# Patient Record
Sex: Male | Born: 1945 | Race: White | Hispanic: No | Marital: Married | Smoking: Never smoker
Health system: Southern US, Community
[De-identification: ages and names within clinical notes are randomized; demographics above are authoritative.]

## PROBLEM LIST (undated history)

## (undated) DIAGNOSIS — E119 Type 2 diabetes mellitus without complications: Secondary | ICD-10-CM

## (undated) DIAGNOSIS — I10 Essential (primary) hypertension: Secondary | ICD-10-CM

---

## 1997-11-22 ENCOUNTER — Ambulatory Visit (HOSPITAL_COMMUNITY): Admission: RE | Admit: 1997-11-22 | Discharge: 1997-11-22 | Payer: Self-pay | Admitting: Internal Medicine

## 1997-11-22 ENCOUNTER — Encounter: Payer: Self-pay | Admitting: Internal Medicine

## 1998-12-17 ENCOUNTER — Other Ambulatory Visit: Admission: RE | Admit: 1998-12-17 | Discharge: 1998-12-17 | Payer: Self-pay | Admitting: General Surgery

## 1998-12-26 ENCOUNTER — Encounter: Payer: Self-pay | Admitting: General Surgery

## 1998-12-26 ENCOUNTER — Encounter: Admission: RE | Admit: 1998-12-26 | Discharge: 1998-12-26 | Payer: Self-pay | Admitting: General Surgery

## 1999-01-04 ENCOUNTER — Encounter (INDEPENDENT_AMBULATORY_CARE_PROVIDER_SITE_OTHER): Payer: Self-pay | Admitting: Specialist

## 1999-01-04 ENCOUNTER — Ambulatory Visit (HOSPITAL_COMMUNITY): Admission: RE | Admit: 1999-01-04 | Discharge: 1999-01-04 | Payer: Self-pay | Admitting: General Surgery

## 1999-01-04 ENCOUNTER — Encounter: Payer: Self-pay | Admitting: General Surgery

## 2000-02-08 ENCOUNTER — Encounter: Payer: Self-pay | Admitting: Emergency Medicine

## 2000-02-08 ENCOUNTER — Emergency Department (HOSPITAL_COMMUNITY): Admission: EM | Admit: 2000-02-08 | Discharge: 2000-02-08 | Payer: Self-pay | Admitting: *Deleted

## 2000-06-16 ENCOUNTER — Encounter: Payer: Self-pay | Admitting: Otolaryngology

## 2000-06-16 ENCOUNTER — Encounter: Admission: RE | Admit: 2000-06-16 | Discharge: 2000-06-16 | Payer: Self-pay | Admitting: Otolaryngology

## 2003-08-22 ENCOUNTER — Encounter: Admission: RE | Admit: 2003-08-22 | Discharge: 2003-08-22 | Payer: Self-pay | Admitting: Internal Medicine

## 2003-09-29 ENCOUNTER — Ambulatory Visit (HOSPITAL_COMMUNITY): Admission: RE | Admit: 2003-09-29 | Discharge: 2003-09-29 | Payer: Self-pay | Admitting: Gastroenterology

## 2003-10-20 ENCOUNTER — Encounter: Admission: RE | Admit: 2003-10-20 | Discharge: 2003-10-20 | Payer: Self-pay | Admitting: Internal Medicine

## 2005-02-27 ENCOUNTER — Ambulatory Visit: Payer: Self-pay | Admitting: Internal Medicine

## 2005-03-31 ENCOUNTER — Ambulatory Visit: Payer: Self-pay | Admitting: Internal Medicine

## 2005-04-09 ENCOUNTER — Ambulatory Visit: Payer: Self-pay | Admitting: Gastroenterology

## 2005-04-23 ENCOUNTER — Encounter (INDEPENDENT_AMBULATORY_CARE_PROVIDER_SITE_OTHER): Payer: Self-pay | Admitting: *Deleted

## 2005-04-23 ENCOUNTER — Ambulatory Visit: Payer: Self-pay | Admitting: Gastroenterology

## 2006-01-29 ENCOUNTER — Ambulatory Visit: Payer: Self-pay | Admitting: Internal Medicine

## 2006-01-29 LAB — CONVERTED CEMR LAB
BUN: 12 mg/dL (ref 6–23)
Creatinine, Ser: 1.2 mg/dL (ref 0.4–1.5)
Hgb A1c MFr Bld: 5.6 % (ref 4.6–6.0)
Potassium: 4.1 meq/L (ref 3.5–5.1)

## 2006-02-04 ENCOUNTER — Encounter: Payer: Self-pay | Admitting: Internal Medicine

## 2006-02-25 ENCOUNTER — Ambulatory Visit: Payer: Self-pay | Admitting: Internal Medicine

## 2006-04-14 ENCOUNTER — Ambulatory Visit (HOSPITAL_COMMUNITY): Admission: RE | Admit: 2006-04-14 | Discharge: 2006-04-14 | Payer: Self-pay | Admitting: General Surgery

## 2006-05-11 ENCOUNTER — Ambulatory Visit: Payer: Self-pay | Admitting: Internal Medicine

## 2006-05-11 LAB — CONVERTED CEMR LAB
ALT: 33 units/L (ref 0–40)
AST: 27 units/L (ref 0–37)
Cholesterol: 247 mg/dL (ref 0–200)
Triglycerides: 179 mg/dL — ABNORMAL HIGH (ref 0–149)

## 2006-08-13 ENCOUNTER — Ambulatory Visit: Payer: Self-pay | Admitting: Emergency Medicine

## 2006-09-18 ENCOUNTER — Other Ambulatory Visit: Payer: Self-pay

## 2006-09-18 ENCOUNTER — Ambulatory Visit: Payer: Self-pay | Admitting: Emergency Medicine

## 2006-09-18 ENCOUNTER — Inpatient Hospital Stay: Payer: Self-pay | Admitting: Internal Medicine

## 2008-02-08 ENCOUNTER — Encounter: Admission: RE | Admit: 2008-02-08 | Discharge: 2008-02-08 | Payer: Self-pay | Admitting: Specialist

## 2008-03-16 ENCOUNTER — Encounter (INDEPENDENT_AMBULATORY_CARE_PROVIDER_SITE_OTHER): Payer: Self-pay | Admitting: *Deleted

## 2009-06-27 ENCOUNTER — Telehealth (INDEPENDENT_AMBULATORY_CARE_PROVIDER_SITE_OTHER): Payer: Self-pay

## 2010-03-05 NOTE — Progress Notes (Signed)
Summary: Schedule overdue colon recall  Phone Note Outgoing Call Call back at [facility] Phone [phone] Call back at Work Phone [phone] Call back at [phone]   Call placed by: Darcey Nora RN, CGRN,  Jun 27, 2009 3:10 PM Call placed to: Patient Summary of Call: Attempted to place a call to the patient at all 3 above numbers to discuss overdue colon recall from 04/2008.  All the above phone numbers have been disconnected.  A recall letter will be mailed to the address on file. Initial call taken by: Darcey Nora RN, CGRN,  Jun 27, 2009 3:14 PM

## 2010-06-13 ENCOUNTER — Other Ambulatory Visit: Payer: Self-pay | Admitting: Dermatology

## 2010-06-21 NOTE — Op Note (Signed)
NAME:  Russell Moody, Russell Moody NO.:  [id number]   MEDICAL RECORD NO.:  [id number]          PATIENT TYPE:  AMB   LOCATION:  DAY                          FACILITY:  [facility]   PHYSICIAN:  Timothy E. Earlene Plater, M.D. DATE OF BIRTH:  [birth year]   DATE OF PROCEDURE:  04/14/2006  DATE OF DISCHARGE:  04/14/2006                               OPERATIVE REPORT   PREOPERATIVE DIAGNOSIS:  Anal warts.   POSTOPERATIVE DIAGNOSIS:  Anal warts.   PROCEDURE:  Carbon dioxide destruction of anal warts.   SURGEON:  Timothy E. Earlene Plater, M.D.   ANESTHESIA:  General.   Mr. Macke is 69, otherwise healthy.  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  Anoderm and rectal  are spared.  He has agreed to this procedure.  He is seen, identified,  and the permit signed.   Patient was taken to the operating room, placed supine.  LMA anesthesia  provided.  He is placed in lithotomy.  The perianal area inspected,  prepped and draped in the usual fashion.  Perianal condylomata were  present in small clusters, covering the skin of the right and left sides  of the anal area.  Using magnification and small beam CO2 laser at 4-6  watts, each of these condylomata were individually destroyed.  The skin  was preserved as much as possible.  All areas were double checked.  There were no residuals.  Dry sterile dressing.  He tolerated it well.  Counts correct.  He was removed to the recovery room in good condition.  Written and verbal instructions were given to him and his wife,  including Tylox 36 for pain.  He will be seen and followed in the  office.      Timothy E. Earlene Plater, M.D.  Electronically Signed     TED/MEDQ  D:  04/14/2006  T:  04/16/2006  Job:  [id number]

## 2011-04-02 ENCOUNTER — Other Ambulatory Visit: Payer: Self-pay | Admitting: Oral Surgery

## 2011-08-19 ENCOUNTER — Encounter: Payer: Self-pay | Admitting: Gastroenterology

## 2012-12-21 ENCOUNTER — Other Ambulatory Visit: Payer: Self-pay | Admitting: Dermatology

## 2013-03-10 ENCOUNTER — Other Ambulatory Visit: Payer: Self-pay | Admitting: Dermatology

## 2014-10-25 ENCOUNTER — Other Ambulatory Visit: Payer: Self-pay | Admitting: Family Medicine

## 2014-11-05 ENCOUNTER — Emergency Department
Admission: EM | Admit: 2014-11-05 | Discharge: 2014-11-05 | Disposition: A | Discharge status: Home / Self Care | Payer: 59 | Attending: Emergency Medicine | Admitting: Emergency Medicine

## 2014-11-05 ENCOUNTER — Encounter: Payer: Self-pay | Admitting: Emergency Medicine

## 2014-11-05 ENCOUNTER — Emergency Department: Payer: 59

## 2014-11-05 DIAGNOSIS — E119 Type 2 diabetes mellitus without complications: Secondary | ICD-10-CM | POA: Diagnosis not present

## 2014-11-05 DIAGNOSIS — W010XXA Fall on same level from slipping, tripping and stumbling without subsequent striking against object, initial encounter: Secondary | ICD-10-CM | POA: Diagnosis not present

## 2014-11-05 DIAGNOSIS — Y9289 Other specified places as the place of occurrence of the external cause: Secondary | ICD-10-CM | POA: Diagnosis not present

## 2014-11-05 DIAGNOSIS — Y998 Other external cause status: Secondary | ICD-10-CM | POA: Diagnosis not present

## 2014-11-05 DIAGNOSIS — S62112A Displaced fracture of triquetrum [cuneiform] bone, left wrist, initial encounter for closed fracture: Secondary | ICD-10-CM

## 2014-11-05 DIAGNOSIS — Z79899 Other long term (current) drug therapy: Secondary | ICD-10-CM | POA: Insufficient documentation

## 2014-11-05 DIAGNOSIS — Y9389 Activity, other specified: Secondary | ICD-10-CM | POA: Diagnosis not present

## 2014-11-05 DIAGNOSIS — I10 Essential (primary) hypertension: Secondary | ICD-10-CM | POA: Insufficient documentation

## 2014-11-05 DIAGNOSIS — S6992XA Unspecified injury of left wrist, hand and finger(s), initial encounter: Secondary | ICD-10-CM | POA: Diagnosis present

## 2014-11-05 HISTORY — DX: Essential (primary) hypertension: I10

## 2014-11-05 HISTORY — DX: Type 2 diabetes mellitus without complications: E11.9

## 2014-11-05 MED ORDER — OXYCODONE-ACETAMINOPHEN 5-325 MG PO TABS
1.0000 | ORAL_TABLET | Freq: Once | ORAL | Status: AC
Start: 2014-11-05 — End: 2014-11-05
  Administered 2014-11-05: 1 via ORAL
  Filled 2014-11-05: qty 1

## 2014-11-05 MED ORDER — OXYCODONE-ACETAMINOPHEN 5-325 MG PO TABS
1.0000 | ORAL_TABLET | Freq: Once | ORAL | Status: AC
  Administered 2014-11-05: 1 via ORAL
  Filled 2014-11-05: qty 1

## 2014-11-05 MED ORDER — OXYCODONE-ACETAMINOPHEN 5-325 MG PO TABS: 1.0000 | ORAL_TABLET | Freq: Four times a day (QID) | ORAL | Status: AC | PRN

## 2014-11-05 NOTE — ED Provider Notes (Signed)
[facility] Emergency Department Provider Note  ____________________________________________  Time seen: Approximately 9:21 AM  I have reviewed the triage vital signs and the nursing notes.   HISTORY  Chief Complaint Fall    HPI Russell Moody is a 69 y.o. male who presents to emergency department complaining of left wrist pain status post falling on stretch left hand last night. He states that he tripped and fell backwards trying to catch himself with his left hand and landing on the wrist. He initially states that initially he thought "I just sprained it" but the pain has continued to increase over the intervening period. He was unable to sleep due to the pain. He denies any numbness or tingling in the digits. He denies any gross deformity but does endorse some swelling.   Past Medical History  Diagnosis Date  . Hypertension   . Diabetes mellitus without complication ([place])     There are no active problems to display for this patient.   History reviewed. No pertinent past surgical history.  Current Outpatient Rx  Name  Route  Sig  Dispense  Refill  . buPROPion (WELLBUTRIN XL) 300 MG 24 hr tablet   Oral   Take 300 mg by mouth daily.         . citalopram (CELEXA) 10 MG tablet   Oral   Take 10 mg by mouth daily.         Marland Kitchen lisinopril (PRINIVIL,ZESTRIL) 20 MG tablet   Oral   Take 20 mg by mouth daily.         . pravastatin (PRAVACHOL) 40 MG tablet   Oral   Take 40 mg by mouth daily.         . tamsulosin (FLOMAX) 0.4 MG CAPS capsule   Oral   Take 0.4 mg by mouth.         . verapamil (CALAN) 120 MG tablet   Oral   Take 120 mg by mouth 3 (three) times daily.         Marland Kitchen oxyCODONE-acetaminophen (ROXICET) 5-325 MG tablet   Oral   Take 1 tablet by mouth every 6 (six) hours as needed for severe pain.   20 tablet   0     Allergies Augmentin  No family history on file.  Social History Social History  Substance Use Topics  .  Smoking status: Never Smoker   . Smokeless tobacco: None  . Alcohol Use: Yes    Review of Systems Constitutional: No fever/chills Eyes: No visual changes. ENT: No sore throat. Cardiovascular: Denies chest pain. Respiratory: Denies shortness of breath. Gastrointestinal: No abdominal pain.  No nausea, no vomiting.  No diarrhea.  No constipation. Genitourinary: Negative for dysuria. Musculoskeletal: Negative for back pain. He endorses left wrist pain. Skin: Negative for rash. Neurological: Negative for headaches, focal weakness or numbness.  10-point ROS otherwise negative.  ____________________________________________   PHYSICAL EXAM:  VITAL SIGNS: ED Triage Vitals  Enc Vitals Group     BP 11/05/14 0846 148/87 mmHg     Pulse Rate 11/05/14 0846 65     Resp 11/05/14 0846 18     Temp 11/05/14 0846 98.1 F (36.7 C)     Temp Source 11/05/14 0846 Oral     SpO2 11/05/14 0846 98 %     Weight 11/05/14 0846 270 lb (122.471 kg)     Height 11/05/14 0846 6' (1.829 m)     Head Cir --      Peak Flow --  Pain Score 11/05/14 0900 9     Pain Loc --      Pain Edu? --      Excl. in [place]? --     Constitutional: Alert and oriented. Well appearing and in no acute distress. Eyes: Conjunctivae are normal. PERRL. EOMI. Head: Atraumatic. Nose: No congestion/rhinnorhea. Mouth/Throat: Mucous membranes are moist.  Oropharynx non-erythematous. Neck: No stridor.   Cardiovascular: Normal rate, regular rhythm. Grossly normal heart sounds.  Good peripheral circulation. Respiratory: Normal respiratory effort.  No retractions. Lungs CTAB. Gastrointestinal: Soft and nontender. No distention. No abdominal bruits. No CVA tenderness. Musculoskeletal: No lower extremity tenderness nor edema.  No joint effusions. Moderate edema noted to left dorsal aspect of wrist. No visible deformity. No ecchymosis, contusion, abrasion, laceration noted. Patient is nontender to palpation over distal yes and ulna. He is  tender to palpation over her carpal bones of left hand. Range of motion of wrist is limited due to pain. Good range of motion of digits distal to injury. Pulses intact. Sensation intact. Neurologic:  Normal speech and language. No gross focal neurologic deficits are appreciated. No gait instability. Skin:  Skin is warm, dry and intact. No rash noted. Psychiatric: Mood and affect are normal. Speech and behavior are normal.  ____________________________________________   LABS (all labs ordered are listed, but only abnormal results are displayed)  Labs Reviewed - No data to display ____________________________________________  EKG   ____________________________________________  RADIOLOGY  Complete left wrist x-Rigdon Impression: Dorsal triquetral fracture. No other fracture, subluxation, or dislocation identified. Soft tissue swelling is noted. ____________________________________________   PROCEDURES  Procedure(s) performed: None  Critical Care performed: No  ____________________________________________   INITIAL IMPRESSION / ASSESSMENT AND PLAN / ED COURSE  Pertinent labs & imaging results that were available during my care of the patient were reviewed by me and considered in my medical decision making (see chart for details).  The patient is a 69 year old male who presents to the emergency department complaining of left wrist pain after a fall yesterday evening. States that he tried to catch himself with his left hand. The pain has increased over the intervening time. Patient's history, symptoms, exam, and radiological findings reveal a triquetral fracture to left wrist. Wrist is stabilized with a fiberglass splint. Patient has a sling and was instructed to use the same. Patient was given pain medication until he can see orthopedics. Patient verbalizes understanding of the diagnosis and the treatment plan. Patient verbalizes compliance with  same. ____________________________________________   FINAL CLINICAL IMPRESSION(S) / ED DIAGNOSES  Final diagnoses:  Triquetral fracture, left, closed, initial encounter      Darletta Moll, PA-C 11/05/14 [place], MD 11/05/14 1234

## 2014-11-05 NOTE — ED Notes (Signed)
Fell yesterday  Injury to left arm

## 2014-11-05 NOTE — Discharge Instructions (Signed)

## 2015-01-12 ENCOUNTER — Encounter: Payer: Self-pay | Admitting: Gastroenterology

## 2015-09-03 ENCOUNTER — Other Ambulatory Visit: Payer: Self-pay | Admitting: Unknown Physician Specialty

## 2015-09-03 DIAGNOSIS — N401 Enlarged prostate with lower urinary tract symptoms: Secondary | ICD-10-CM

## 2015-09-03 DIAGNOSIS — R801 Persistent proteinuria, unspecified: Secondary | ICD-10-CM

## 2015-09-03 DIAGNOSIS — N410 Acute prostatitis: Secondary | ICD-10-CM

## 2015-09-03 DIAGNOSIS — R3911 Hesitancy of micturition: Secondary | ICD-10-CM

## 2015-09-06 ENCOUNTER — Ambulatory Visit
Admission: RE | Admit: 2015-09-06 | Discharge: 2015-09-06 | Disposition: A | Discharge status: Home / Self Care | Payer: 59 | Source: Ambulatory Visit | Attending: Unknown Physician Specialty | Admitting: Unknown Physician Specialty

## 2015-09-06 DIAGNOSIS — R3911 Hesitancy of micturition: Secondary | ICD-10-CM | POA: Diagnosis not present

## 2015-09-06 DIAGNOSIS — N401 Enlarged prostate with lower urinary tract symptoms: Secondary | ICD-10-CM | POA: Diagnosis not present

## 2015-09-06 DIAGNOSIS — N281 Cyst of kidney, acquired: Secondary | ICD-10-CM | POA: Diagnosis not present

## 2015-09-06 DIAGNOSIS — R801 Persistent proteinuria, unspecified: Secondary | ICD-10-CM | POA: Diagnosis not present

## 2015-09-06 DIAGNOSIS — N410 Acute prostatitis: Secondary | ICD-10-CM | POA: Diagnosis not present

## 2015-09-06 DIAGNOSIS — R93421 Abnormal radiologic findings on diagnostic imaging of right kidney: Secondary | ICD-10-CM | POA: Diagnosis not present

## 2016-01-23 ENCOUNTER — Other Ambulatory Visit: Payer: Self-pay | Admitting: Neurology

## 2016-01-23 DIAGNOSIS — G44209 Tension-type headache, unspecified, not intractable: Secondary | ICD-10-CM

## 2016-02-07 ENCOUNTER — Ambulatory Visit
Admission: RE | Admit: 2016-02-07 | Discharge: 2016-02-07 | Disposition: A | Discharge status: Home / Self Care | Payer: 59 | Source: Ambulatory Visit | Attending: Neurology | Admitting: Neurology

## 2016-02-07 DIAGNOSIS — R51 Headache: Secondary | ICD-10-CM | POA: Diagnosis present

## 2016-02-07 DIAGNOSIS — D320 Benign neoplasm of cerebral meninges: Secondary | ICD-10-CM | POA: Insufficient documentation

## 2016-02-07 DIAGNOSIS — G44209 Tension-type headache, unspecified, not intractable: Secondary | ICD-10-CM

## 2016-02-07 DIAGNOSIS — J329 Chronic sinusitis, unspecified: Secondary | ICD-10-CM | POA: Diagnosis not present

## 2016-02-07 DIAGNOSIS — D329 Benign neoplasm of meninges, unspecified: Secondary | ICD-10-CM | POA: Diagnosis present

## 2016-02-07 MED ORDER — GADOBENATE DIMEGLUMINE 529 MG/ML IV SOLN: 20.0000 mL | Freq: Once | INTRAVENOUS | Status: DC | PRN

## 2016-04-21 ENCOUNTER — Other Ambulatory Visit: Payer: Self-pay | Admitting: Neurology

## 2016-04-21 DIAGNOSIS — R519 Headache, unspecified: Secondary | ICD-10-CM

## 2016-04-21 DIAGNOSIS — D329 Benign neoplasm of meninges, unspecified: Secondary | ICD-10-CM

## 2016-04-21 DIAGNOSIS — R51 Headache: Principal | ICD-10-CM

## 2016-06-20 ENCOUNTER — Ambulatory Visit
Admission: RE | Admit: 2016-06-20 | Discharge: 2016-06-20 | Disposition: A | Discharge status: Home / Self Care | Payer: 59 | Source: Ambulatory Visit | Attending: Neurology | Admitting: Neurology

## 2016-06-20 DIAGNOSIS — D329 Benign neoplasm of meninges, unspecified: Secondary | ICD-10-CM | POA: Diagnosis not present

## 2016-06-20 DIAGNOSIS — R51 Headache: Secondary | ICD-10-CM | POA: Insufficient documentation

## 2016-06-20 DIAGNOSIS — R519 Headache, unspecified: Secondary | ICD-10-CM

## 2016-06-20 MED ORDER — GADOBENATE DIMEGLUMINE 529 MG/ML IV SOLN
20.0000 mL | Freq: Once | INTRAVENOUS | Status: AC | PRN
  Administered 2016-06-20: 20 mL via INTRAVENOUS

## 2016-07-08 ENCOUNTER — Ambulatory Visit: Payer: Medicare Other

## 2017-01-25 ENCOUNTER — Emergency Department (HOSPITAL_COMMUNITY)
Admission: EM | Admit: 2017-01-25 | Discharge: 2017-01-26 | Disposition: A | Discharge status: Home / Self Care | Payer: 59 | Attending: Emergency Medicine | Admitting: Emergency Medicine

## 2017-01-25 ENCOUNTER — Other Ambulatory Visit: Payer: Self-pay

## 2017-01-25 ENCOUNTER — Emergency Department (HOSPITAL_COMMUNITY): Payer: 59

## 2017-01-25 ENCOUNTER — Encounter (HOSPITAL_COMMUNITY): Payer: Self-pay | Admitting: Emergency Medicine

## 2017-01-25 DIAGNOSIS — Z79899 Other long term (current) drug therapy: Secondary | ICD-10-CM | POA: Diagnosis not present

## 2017-01-25 DIAGNOSIS — R1013 Epigastric pain: Secondary | ICD-10-CM | POA: Diagnosis present

## 2017-01-25 DIAGNOSIS — I10 Essential (primary) hypertension: Secondary | ICD-10-CM | POA: Insufficient documentation

## 2017-01-25 DIAGNOSIS — E119 Type 2 diabetes mellitus without complications: Secondary | ICD-10-CM | POA: Diagnosis not present

## 2017-01-25 DIAGNOSIS — R079 Chest pain, unspecified: Secondary | ICD-10-CM | POA: Diagnosis not present

## 2017-01-25 LAB — CBC
HCT: 44.9 % (ref 39.0–52.0)
HEMOGLOBIN: 15.6 g/dL (ref 13.0–17.0)
MCH: 30.9 pg (ref 26.0–34.0)
MCHC: 34.7 g/dL (ref 30.0–36.0)
MCV: 88.9 fL (ref 78.0–100.0)
PLATELETS: 101 10*3/uL — AB (ref 150–400)
RBC: 5.05 MIL/uL (ref 4.22–5.81)
RDW: 13.7 % (ref 11.5–15.5)
WBC: 7 10*3/uL (ref 4.0–10.5)

## 2017-01-25 LAB — I-STAT TROPONIN, ED: TROPONIN I, POC: 0.01 ng/mL (ref 0.00–0.08)

## 2017-01-25 LAB — COMPREHENSIVE METABOLIC PANEL
ALBUMIN: 3.4 g/dL — AB (ref 3.5–5.0)
ALT: 27 U/L (ref 17–63)
ANION GAP: 8 (ref 5–15)
AST: 49 U/L — AB (ref 15–41)
Alkaline Phosphatase: 88 U/L (ref 38–126)
BILIRUBIN TOTAL: 1.1 mg/dL (ref 0.3–1.2)
BUN: 16 mg/dL (ref 6–20)
CHLORIDE: 108 mmol/L (ref 101–111)
CO2: 21 mmol/L — ABNORMAL LOW (ref 22–32)
Calcium: 8.4 mg/dL — ABNORMAL LOW (ref 8.9–10.3)
Creatinine, Ser: 1.34 mg/dL — ABNORMAL HIGH (ref 0.61–1.24)
GFR calc Af Amer: 59 mL/min — ABNORMAL LOW (ref >60)
GFR, EST NON AFRICAN AMERICAN: 51 mL/min — AB (ref >60)
Glucose, Bld: 224 mg/dL — ABNORMAL HIGH (ref 65–99)
POTASSIUM: 3.8 mmol/L (ref 3.5–5.1)
Sodium: 137 mmol/L (ref 135–145)
TOTAL PROTEIN: 6.3 g/dL — AB (ref 6.5–8.1)

## 2017-01-25 LAB — LIPASE, BLOOD: Lipase: 32 U/L (ref 11–51)

## 2017-01-25 MED ORDER — GI COCKTAIL ~~LOC~~
30.0000 mL | Freq: Once | ORAL | Status: AC
  Administered 2017-01-25: 30 mL via ORAL
  Filled 2017-01-25: qty 30

## 2017-01-25 NOTE — ED Notes (Signed)
Pt states that he is getting a little bit of a headache and "seems like the pain in his chest is creeping up again". Will speak with the MD.

## 2017-01-25 NOTE — ED Triage Notes (Signed)
Per EMS, pt c/o non-radiating epigastric pain that began approx. 1 hour PTA. Pt also c/o nausea, denies vomiting. No shortness of breath/lightheadedness/dizziness. 324 aspirin and 2NTG given with no change in pain.EMS vitals: BP-112/75, SpO2-94% room air, HR 77

## 2017-01-25 NOTE — ED Notes (Signed)
Patient transported to [facility] 

## 2017-01-25 NOTE — Discharge Instructions (Signed)
Please read and follow all provided instructions.  Your diagnoses today include:  1. Chest pain, unspecified type     Tests performed today include: An EKG of your heart A chest x-Harp Cardiac enzymes - a blood test for heart muscle damage Blood counts and electrolytes Vital signs. See below for your results today.   Medications prescribed:   Take any prescribed medications only as directed.  Follow-up instructions: Please follow-up with your primary care provider as soon as you can for further evaluation of your symptoms.   Return instructions:  SEEK IMMEDIATE MEDICAL ATTENTION IF: You have severe chest pain, especially if the pain is crushing or pressure-like and spreads to the arms, back, neck, or jaw, or if you have sweating, nausea (feeling sick to your stomach), or shortness of breath. THIS IS AN EMERGENCY. Don't wait to see if the pain will go away. Get medical help at once. Call 911 or 0 (operator). DO NOT drive yourself to the hospital.  Your chest pain gets worse and does not go away with rest.  You have an attack of chest pain lasting longer than usual, despite rest and treatment with the medications your caregiver has prescribed.  You wake from sleep with chest pain or shortness of breath. You feel dizzy or faint. You have chest pain not typical of your usual pain for which you originally saw your caregiver.  You have any other emergent concerns regarding your health.  Additional Information: Chest pain comes from many different causes. Your caregiver has diagnosed you as having chest pain that is not specific for one problem, but does not require admission.  You are at low risk for an acute heart condition or other serious illness.   Your vital signs today were: BP 135/88    Pulse 76    Temp 98.2 F (36.8 C) (Oral)    Resp (!) 21    Ht 6' (1.829 m)    Wt 122.5 kg (270 lb)    SpO2 96%    BMI 36.62 kg/m  If your blood pressure (BP) was elevated above 135/85 this visit,  please have this repeated by your doctor within one month. --------------

## 2017-01-25 NOTE — ED Provider Notes (Signed)
[facility] EMERGENCY DEPARTMENT Provider Note   CSN: [id number] Arrival date & time: 01/25/17  2217     History   Chief Complaint Chief Complaint  Patient presents with  . Chest Pain    HPI Russell Moody is a 71 y.o. male.  HPI  71 y.o. male with a hx of HTN, DM, presents to the Emergency Department today via EMS due to non radiating epigastric pain that occurred 1 hour PTA. States this occurred when he started lying down for bed. Notes nausea without emesis. No SOB. Denies diaphoresis. Notes pain 15/10 initially until EMS arrived and gave 324 ASA as well as NTG x2. States pain reduced to 5/10. States it just feels like a pressure in is stomach. No N/V. Denies chest pain. No numbness/tingling. No hx ACS. Denies cough/congestion. No fevers. No numbness in BLE. No other symptoms noted   Past Medical History:  Diagnosis Date  . Diabetes mellitus without complication ([place])   . Hypertension     There are no active problems to display for this patient.   History reviewed. No pertinent surgical history.     Home Medications    Prior to Admission medications   Medication Sig Start Date End Date Taking? Authorizing Provider  buPROPion (WELLBUTRIN XL) 300 MG 24 hr tablet Take 300 mg by mouth daily.    [provider]  citalopram (CELEXA) 10 MG tablet Take 10 mg by mouth daily.    [provider]  lisinopril (PRINIVIL,ZESTRIL) 20 MG tablet Take 20 mg by mouth daily.    [provider]  oxyCODONE-acetaminophen (ROXICET) 5-325 MG tablet Take 1 tablet by mouth every 6 (six) hours as needed for severe pain. 11/05/14   Cuthriell, Charline Bills, PA-C  pravastatin (PRAVACHOL) 40 MG tablet Take 40 mg by mouth daily.    [provider]  tamsulosin (FLOMAX) 0.4 MG CAPS capsule Take 0.4 mg by mouth.    [provider]  verapamil (CALAN) 120 MG tablet Take 120 mg by mouth 3 (three) times daily.    [provider]     Family History No family history on file.  Social History Social History   Tobacco Use  . Smoking status: Never Smoker  . Smokeless tobacco: Never Used  Substance Use Topics  . Alcohol use: Yes  . Drug use: Not on file     Allergies   Augmentin [amoxicillin-pot clavulanate]   Review of Systems Review of Systems ROS reviewed and all are negative for acute change except as noted in the HPI.  Physical Exam Updated Vital Signs Pulse 77   Temp 98.2 F (36.8 C) (Oral)   Resp 16   Ht 6' (1.829 m)   Wt 122.5 kg (270 lb)   SpO2 94%   BMI 36.62 kg/m   Physical Exam  Constitutional: He is oriented to person, place, and time. He appears well-developed and well-nourished. No distress.  HENT:  Head: Normocephalic and atraumatic.  Right Ear: Tympanic membrane, external ear and ear canal normal.  Left Ear: Tympanic membrane, external ear and ear canal normal.  Nose: Nose normal.  Mouth/Throat: Uvula is midline, oropharynx is clear and moist and mucous membranes are normal. No trismus in the jaw. No oropharyngeal exudate, posterior oropharyngeal erythema or tonsillar abscesses.  Eyes: EOM are normal. Pupils are equal, round, and reactive to light.  Neck: Normal range of motion. Neck supple. No tracheal deviation present.  Cardiovascular: Normal rate, regular rhythm, S1 normal, S2 normal, normal  heart sounds, intact distal pulses and normal pulses.  Pulmonary/Chest: Effort normal and breath sounds normal. No respiratory distress. He has no decreased breath sounds. He has no wheezes. He has no rhonchi. He has no rales.  Abdominal: Normal appearance and bowel sounds are normal. There is no hepatosplenomegaly, splenomegaly or hepatomegaly. There is tenderness in the epigastric area. There is no rigidity, no rebound, no guarding, no CVA tenderness, no tenderness at McBurney's point and negative Murphy's sign. No hernia.  Abdomen soft  Musculoskeletal: Normal range of motion.   Neurological: He is alert and oriented to person, place, and time.  Skin: Skin is warm and dry.  Psychiatric: He has a normal mood and affect. His speech is normal and behavior is normal. Thought content normal.  Nursing note and vitals reviewed.    ED Treatments / Results  Labs (all labs ordered are listed, but only abnormal results are displayed) Labs Reviewed  CBC - Abnormal; Notable for the following components:      Result Value   Platelets 101 (*)    All other components within normal limits  COMPREHENSIVE METABOLIC PANEL - Abnormal; Notable for the following components:   CO2 21 (*)    Glucose, Bld 224 (*)    Creatinine, Ser 1.34 (*)    Calcium 8.4 (*)    Total Protein 6.3 (*)    Albumin 3.4 (*)    AST 49 (*)    GFR calc non Af Amer 51 (*)    GFR calc Af Amer 59 (*)    All other components within normal limits  LIPASE, BLOOD  I-STAT TROPONIN, ED    EKG  EKG Interpretation  Date/Time:  Sunday January 25 2017 22:22:58 EST Ventricular Rate:  76 PR Interval:    QRS Duration: 127 QT Interval:  408 QTC Calculation: 459 R Axis:   -20 Text Interpretation:  Sinus rhythm Left ventricular hypertrophy Artifact T wave abnormality No significant change since last tracing Abnormal ekg Confirmed by Carmin Muskrat [phone]) on 01/25/2017 10:38:05 PM       Radiology Dg Chest 2 View  Result Date: 01/25/2017 CLINICAL DATA:  Acute onset of epigastric abdominal pain and squeezing sensation. Nausea. EXAM: CHEST  2 VIEW COMPARISON:  Chest radiograph performed 02/08/2008 FINDINGS: The lungs are well-aerated and clear. There is no evidence of focal opacification, pleural effusion or pneumothorax. The heart is normal in size; the mediastinal contour is within normal limits. No acute osseous abnormalities are seen. The patient is status post right-sided rotator cuff repair. IMPRESSION: No acute cardiopulmonary process seen. Electronically Signed   By: Garald Balding M.D.   On:  01/25/2017 22:58    Procedures Procedures (including critical care time)  Medications Ordered in ED Medications  gi cocktail (Maalox,Lidocaine,Donnatal) (30 mLs Oral Given 01/25/17 2313)     Initial Impression / Assessment and Plan / ED Course  I have reviewed the triage vital signs and the nursing notes.  Pertinent labs & imaging results that were available during my care of the patient were reviewed by me and considered in my medical decision making (see chart for details).  Final Clinical Impressions(s) / ED Diagnoses  {I have reviewed and evaluated the relevant laboratory values. {I have reviewed and evaluated the relevant imaging studies. {I have interpreted the relevant EKG. {I have reviewed the relevant previous healthcare records. {I have reviewed EMS Documentation. {I obtained HPI from historian. {Patient discussed with supervising physician.  ED Course:  Assessment: Pt is a 71 y.o. male  with a hx of HTN, DM, presents to the Emergency Department today via EMS due to non radiating epigastric pain that occurred 1 hour PTA. States this occurred when he started lying down for bed. Notes nausea without emesis. No SOB. Denies diaphoresis. Notes pain 15/10 initially until EMS arrived and gave 324 ASA as well as NTG x2. States pain reduced to 5/10. States it just feels like a pressure in is stomach. No N/V. Denies chest pain. No numbness/tingling. No hx ACS. Denies cough/congestion. No fevers. No numbness in BLE. On exam, pt in NAD. Nontoxic/nonseptic appearing. VSS. Afebrile. Lungs CTA. Heart RRR. Abdomen TTP epigastric region. Soft to palpation. No pulsatile masses. EKG unremarkable. Trop negative. CBC unremarkable. BMP unremarkable. CXR negative. Given GI cocktail in ED with relief. Denies pain currently. Plan is to DC home after second troponin if unremarkable. Will Rx Protonix upon DC. Discussed strict return precautions and close follow up with PCP.  Disposition/Plan:  Sign out to  Charlann Lange, PA-C for Delta Trop Pt acknowledges and agrees with plan  Supervising Physician Carmin Muskrat, MD  Final diagnoses:  Chest pain, unspecified type    ED Discharge Orders    None       Conni Slipper 01/25/17 2353    Carmin Muskrat, MD 01/25/17 [phone]

## 2017-01-26 DIAGNOSIS — R079 Chest pain, unspecified: Secondary | ICD-10-CM | POA: Diagnosis not present

## 2017-01-26 LAB — I-STAT TROPONIN, ED: TROPONIN I, POC: 0 ng/mL (ref 0.00–0.08)

## 2017-01-26 NOTE — ED Notes (Signed)
Patient denies pain and is resting comfortably.  

## 2017-01-26 NOTE — ED Notes (Signed)
Pt up to the restroom. Steady gait. VSS.

## 2017-01-26 NOTE — ED Provider Notes (Signed)
Epigastric pain just PTA after lying down EMS gave NTG and ASA with some relief. GI cocktail with resolution EKG, trop negative Pending delta troponin  Plan: anticipate discharge home. Seen by Dr. Vanita Panda and considered low likelihood for ACS.   Delta troponin is negative. Recheck on the patient: he reports he has not had any recurrent pain. He can be discharged home with outpatient follow up.   Charlann Lange, PA-C 01/26/17 0308    Carmin Muskrat, MD 02/04/17 Natasha Mead

## 2017-08-08 IMAGING — MR MR HEAD WO/W CM
12 series · 48 of 48 positions shown · IV contrast (multihance)
Comparison: 02/07/2016

CLINICAL DATA: Followup right parietal meningioma.

EXAM:
MRI HEAD WITHOUT AND WITH CONTRAST
TECHNIQUE: Multiplanar, multiecho pulse sequences of the brain and surrounding
structures were obtained without and with intravenous contrast.
CONTRAST:  20mL MULTIHANCE GADOBENATE DIMEGLUMINE 529 MG/ML IV SOLN

[Series 2: T1 · sagittal · 5.8mm · 0.45mm/px · 1 of 23 slices shown (1 of 2)]
[im 1/23]
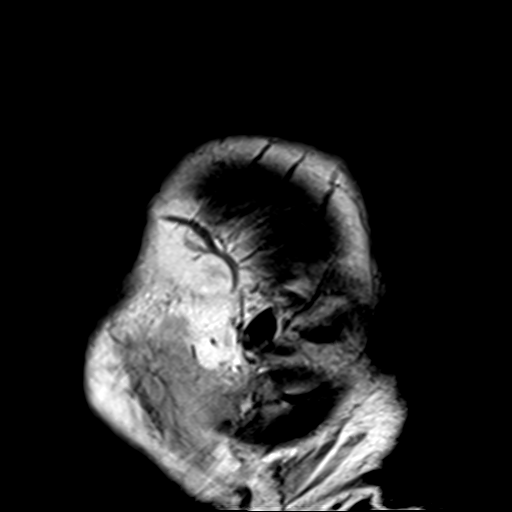

[Series 4: DWI · axial · 3.0mm · 1.80mm/px · z∈[-118,+44]mm · 2 of 52 slices shown (1 of 2)]
[im 1/52]
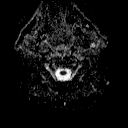
[im 52/52]
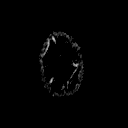

[Series 6: DWI · coronal · 3.3mm · 1.80mm/px · 3 of 45 slices shown (2 of 2)]
[im 1/45]
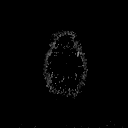
[im 23/45]
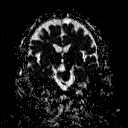
[im 45/45]
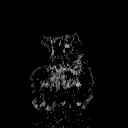

[Series 7: T2 · axial · 5.0mm · 0.60mm/px · z∈[-117,+39]mm · 2 of 25 slices shown (1 of 2)]
[im 1/25]
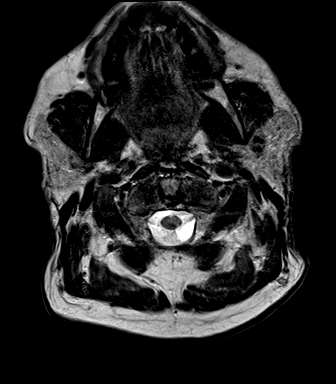
[im 25/25]
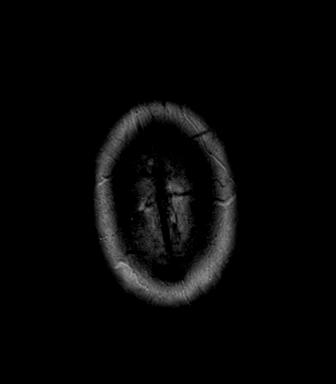

[Series 8: FLAIR · axial · 3.0mm · 0.45mm/px · z∈[-117,+39]mm · 3 of 53 slices shown]
[im 1/53]
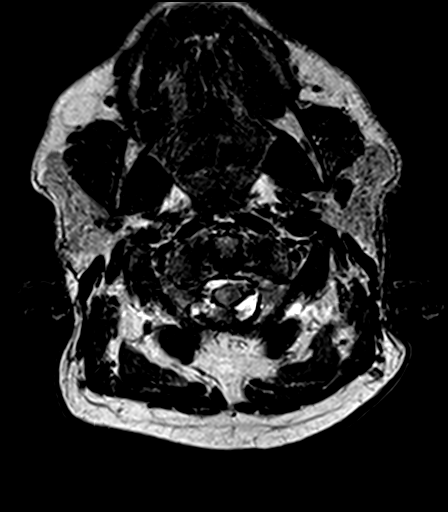
[im 27/53]
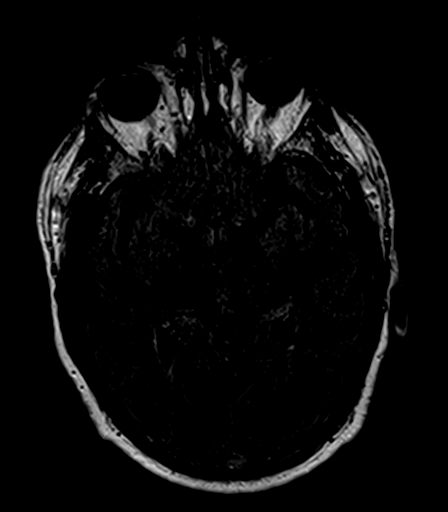
[im 53/53]
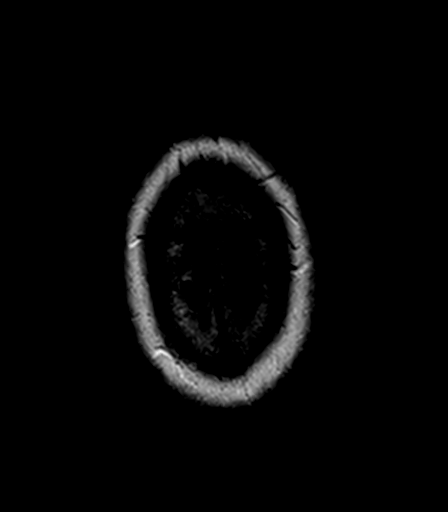

[Series 9: T2 · axial · 5.0mm · 0.45mm/px · z∈[-117,+39]mm · 2 of 25 slices shown (2 of 2)]
[im 1/25]
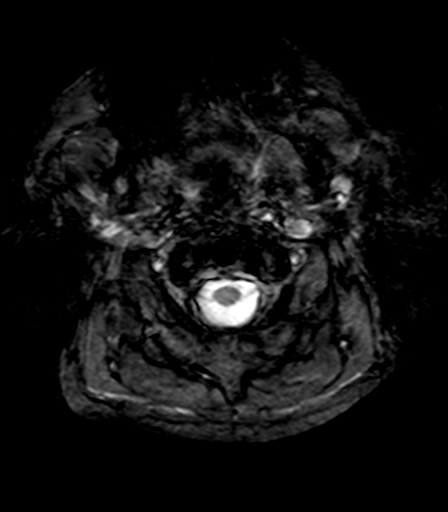
[im 25/25]
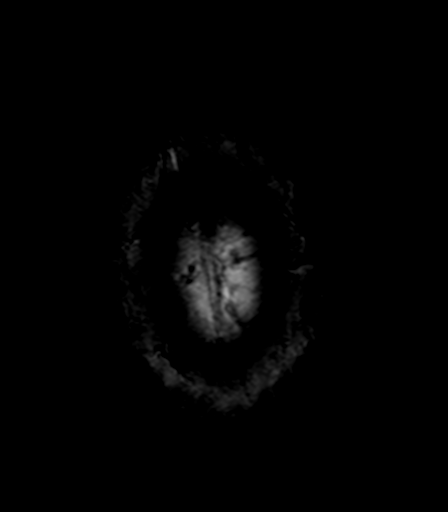

[Series 10: T1 · axial · 1.0mm · 1.00mm/px · z∈[-125,+49]mm · 12 of 176 slices shown (2 of 2)]
[im 1/176]
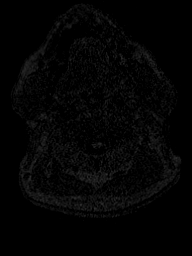
[im 16/176]
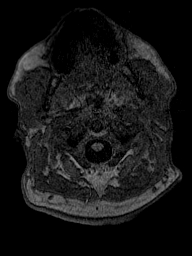
[im 32/176]
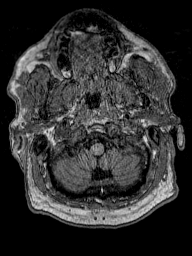
[im 48/176]
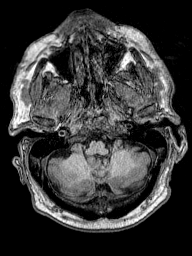
[im 64/176]
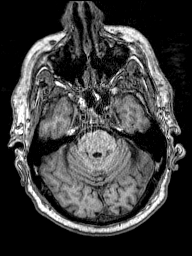
[im 80/176]
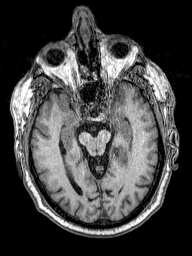
[im 96/176]
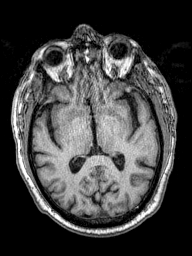
[im 112/176]
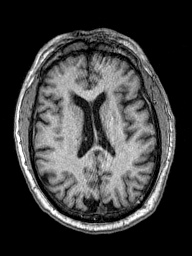
[im 128/176]
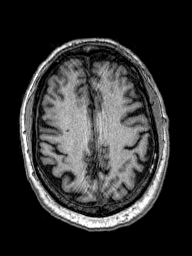
[im 144/176]
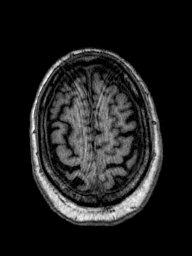
[im 160/176]
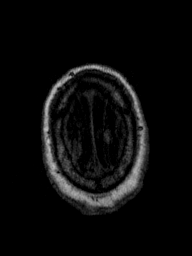
[im 176/176]
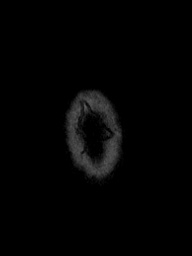

[Series 11: T2 post-contrast · coronal · 5.5mm · 0.49mm/px · 2 of 27 slices shown]
[im 1/27]
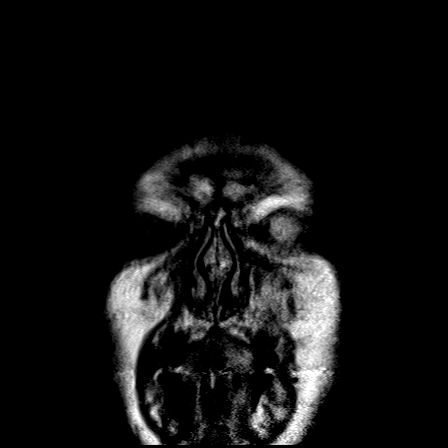
[im 27/27]
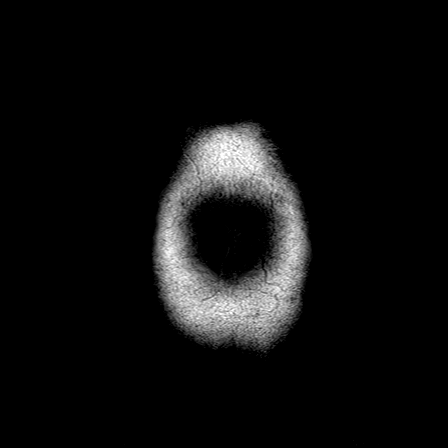

[Series 12: T1 post-contrast · axial · 1.0mm · 1.00mm/px · z∈[-125,+49]mm · 12 of 176 slices shown (1 of 2)]
[im 1/176]
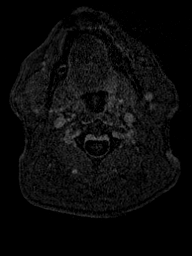
[im 16/176]
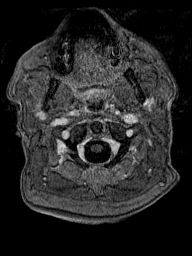
[im 32/176]
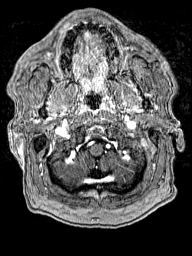
[im 48/176]
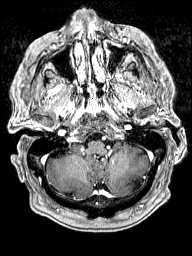
[im 64/176]
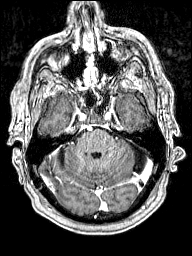
[im 80/176]
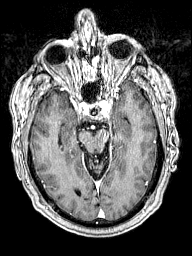
[im 96/176]
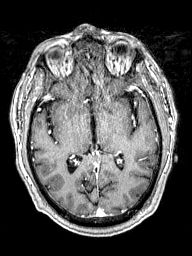
[im 112/176]
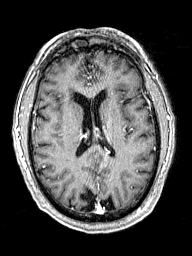
[im 128/176]
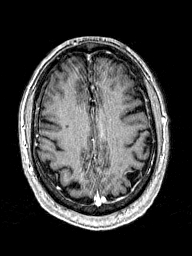
[im 144/176]
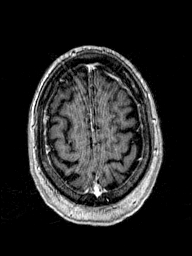
[im 160/176]
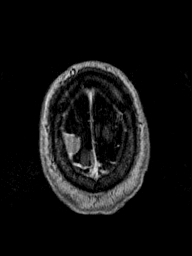
[im 176/176]
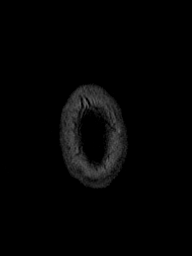

[Series 13: T1 post-contrast · coronal · 5.5mm · 0.43mm/px · 2 of 27 slices shown (2 of 2)]
[im 1/27]
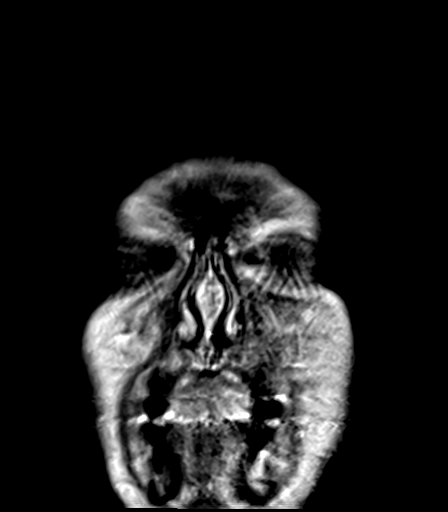
[im 27/27]
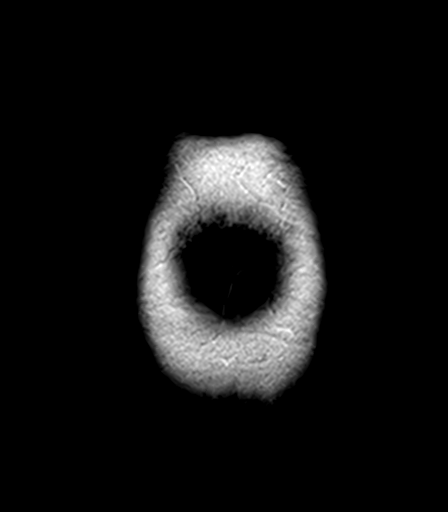

[Series 100: ax (id) · axial · 3.0mm · 1.80mm/px · z∈[-118,+44]mm · 4 of 55 slices shown]
[im 1/55]
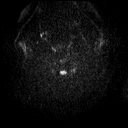
[im 19/55]
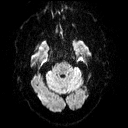
[im 37/55]
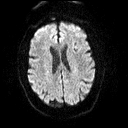
[im 55/55]
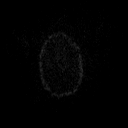

[Series 101: cor (id) · coronal · 3.3mm · 1.80mm/px · 3 of 45 slices shown]
[im 1/45]
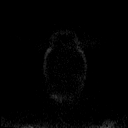
[im 23/45]
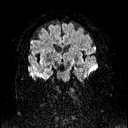
[im 45/45]
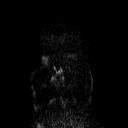

[48 of 48 positions shown; findings below may reference images not displayed]

FINDINGS: Brain: Diffusion imaging does not show any acute or subacute
infarction. There are scattered punctate foci of T2 and FLAIR signal
within the cerebral hemispheric white matter consistent with mild
chronic small-vessel disease. No cortical or large vessel territory
infarction. No intra-axial mass lesion, hemorrhage, hydrocephalus or
extra-axial fluid collection. Right posterior frontal convexity
meningioma is unchanged, with maximal dimension of 2.7 cm right to
left measured on the coronal image, 12 mm thickness and 18 mm AP
diameter. The lesion as well separated from the sagittal sinus.

Vascular: Major vessels at the base of the brain show flow.

Skull and upper cervical spine: Negative

Sinuses/Orbits: Clear/normal

Other: None significant
IMPRESSION: Stable exam since the study [DATE]. Right posterior frontal
convexity meningioma with maximal dimensions of 27 x 18 x 12 mm.
When measured using the same technique, this is unchanged.

Mild chronic small-vessel change of the cerebral hemispheric white
matter without evidence of acute or subacute insult.
